# Patient Record
Sex: Male | Born: 1995 | Race: White | Hispanic: No | Marital: Single | Smoking: Never smoker
Health system: Southern US, Community
[De-identification: ages and names within clinical notes are randomized; demographics above are authoritative.]

## PROBLEM LIST (undated history)

## (undated) DIAGNOSIS — E0590 Thyrotoxicosis, unspecified without thyrotoxic crisis or storm: Secondary | ICD-10-CM

## (undated) DIAGNOSIS — E049 Nontoxic goiter, unspecified: Secondary | ICD-10-CM

## (undated) HISTORY — DX: Nontoxic goiter, unspecified: E04.9

## (undated) HISTORY — DX: Thyrotoxicosis, unspecified without thyrotoxic crisis or storm: E05.90

---
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
## 2016-02-18 ENCOUNTER — Ambulatory Visit (INDEPENDENT_AMBULATORY_CARE_PROVIDER_SITE_OTHER): Payer: PRIVATE HEALTH INSURANCE | Admitting: Pediatric Endocrinology

## 2016-02-18 ENCOUNTER — Encounter (INDEPENDENT_AMBULATORY_CARE_PROVIDER_SITE_OTHER): Payer: Self-pay | Admitting: Pediatric Endocrinology

## 2016-02-18 DIAGNOSIS — N529 Male erectile dysfunction, unspecified: Secondary | ICD-10-CM

## 2016-02-18 DIAGNOSIS — Z8639 Personal history of other endocrine, nutritional and metabolic disease: Secondary | ICD-10-CM | POA: Diagnosis not present

## 2016-02-18 NOTE — Patient Instructions (Signed)
Please have morning labs drawn before 10 am one day in the next week.   Labs have been sent to [facility]- if you need them converted to [facility]- please let us know.   If your testosterone level is low- will plan to repeat with some additional testing. If it remains low we can treat with better options that a supplement from [facility].

## 2016-02-18 NOTE — Progress Notes (Signed)
Subjective:  Subjective  Patient Name: Brett Villegas Date of Birth: 13-Jan-1996  MRN: [id number]  Brett Villegas  presents to the office today for follow-up evaluation and management of his Graves disease with hyperthyroidism  HISTORY OF PRESENT ILLNESS:   Brett Villegas is a 20 y.o. Caucasian male   Brett Villegas was accompanied by his self   1. "Brett Villegas" was diagnosed with hyperthyroidism in August 2012. His mother thinks that he had symptoms of Grave's disease for about 1 year prior to diagnosis. He was started on propranolol and methimazole at that time. He developed hives 1 week after starting methimazole and was started on benadryl at that time. He was given a course of steroids and changed from benadryl to attarax when he developed joint swelling and painful hands in addition to the hives. (about 2 weeks after starting methimazole). His symptoms were ascribed to being secondary to his hyperthryoidism.      2. The patient's last [facility] visit was on 02/12/15. In the interim, he has been generally healthy.   He has been busy with work and school. He feels pretty good. He is recuperating from a pinched nerve in his leg for which needed high dose steroids. He is doing plumbing.   He has questions again about D-aspartic acid because he was worried about his gonadal function. He did take it for awhile and felt better on it- but has stopped taking it. He felt sluggish with low sex drive. He is still having spontaneous erections and has not noticed any change in his testes. He has had some issues maintaining an erection but not all the time. He does admit to frequent marijuana use.   He has continued off Methimazole. He has had good temperature regulation and no GI concerns. Weight has been pretty stable to up slightly.   He is no longer having itching or bruising on his legs.    3. Pertinent Review of Systems:  Constitutional: The patient feels "pretty good". The patient seems healthy and active. Eyes:  Vision seems to be good. There are no recognized eye problems. Neck: The patient has no complaints of anterior neck swelling, soreness, tenderness, pressure, discomfort, or difficulty swallowing.   Heart: Heart rate increases with exercise or other physical activity. The patient has no complaints of palpitations, irregular heart beats, chest pain, or chest pressure.   Gastrointestinal: Bowel movents seem normal. The patient has no complaints of excessive hunger, acid reflux, upset stomach, stomach aches or pains, diarrhea, or constipation.  Legs: Muscle mass and strength seem normal. There are no complaints of numbness, tingling, burning, or pain. No edema is noted.  Feet: There are no obvious foot problems. There are no complaints of numbness, tingling, burning, or pain. No edema is noted. Neurologic: There are no recognized problems with muscle movement and strength, sensation, or coordination. GYN/GU: per HPI Skin: no issues   PAST MEDICAL, FAMILY, AND SOCIAL HISTORY  Past Medical History:  Diagnosis Date  . Goiter   . Hyperthyroidism     Family History  Problem Relation Age of Onset  . Thyroid disease Mother   . Cancer Mother   . Heart disease Paternal Aunt   . Heart disease Paternal Uncle   . Cancer Maternal Grandmother   . Cancer Maternal Grandfather   . Stroke Paternal Grandfather      Current Outpatient Prescriptions:  .  hydrOXYzine (ATARAX/VISTARIL) 10 MG tablet, Take 10 mg by mouth 3 (three) times daily as needed., Disp: , Rfl:  .  lidocaine-prilocaine (  EMLA) cream, Apply 1 application topically as needed. (Patient not taking: Reported on 02/18/2016), Disp: 30 g, Rfl: 4 .  methimazole (TAPAZOLE) 5 MG tablet, Take 1.5 tablets (7.5 mg total) by mouth daily. (Patient not taking: Reported on 02/18/2016), Disp: 45 tablet, Rfl: 6 .  predniSONE (STERAPRED UNI-PAK) 10 MG tablet, 2 tablets each morning for 5 days (Patient not taking: Reported on 02/18/2016), Disp: 10 tablet, Rfl:  1  Allergies as of 02/18/2016 - Review Complete 02/18/2016  Allergen Reaction Noted  . Dextromethorphan polistirex er  10/10/2010     reports that he has never smoked. He has never used smokeless tobacco. He reports that he does not drink alcohol or use drugs. Pediatric History  Patient Guardian Status  . Mother:  Brett Villegas  . Father:  Brett Villegas   Other Topics Concern  . Not on file   Social History Narrative   Lives with mom dad and sister. Golf. Fishing. Hunting.    2nd year at [employer]. Wants to transfer to [employer]. Wants to major in agriculture. Primary Care Provider: Hadley PenOBBINS,Brett Villegas  ROS: There are no other significant problems involving Brett Villegas's other body systems.    Objective:  Objective  Vital Signs: BP 106/72   Pulse 78   Wt 156 lb (70.8 kg)     Ht Readings from Last 3 Encounters:  01/30/14 5' 10.32" (1.786 m) (65 %, Z= 0.38)*  09/27/13 5' 10.35" (1.787 m) (67 %, Z= 0.43)*  04/20/13 5' 10.28" (1.785 m) (68 %, Z= 0.47)*   * Growth percentiles are based on CDC 2-20 Years data.   Wt Readings from Last 3 Encounters:  02/18/16 156 lb (70.8 kg) (52 %, Z= 0.05)*  02/12/15 148 lb 9.6 oz (67.4 kg) (46 %, Z= -0.10)*  01/30/14 140 lb (63.5 kg) (39 %, Z= -0.27)*   * Growth percentiles are based on CDC 2-20 Years data.   HC Readings from Last 3 Encounters:  [facility]   There is no height or weight on file to calculate BSA. No height on file for this encounter. 52 %ile (Z= 0.05) based on CDC 2-20 Years weight-for-age data using vitals from 02/18/2016.    PHYSICAL EXAM:  Constitutional: The patient appears healthy and well nourished. The patient's height and weight are normal for age.  Head: The head is normocephalic. Face: The face appears normal. There are no obvious dysmorphic features. Eyes: The eyes appear to be normally formed and spaced. Gaze is conjugate. There is no obvious arcus or proptosis. Moisture appears normal. Ears: The ears  are normally placed and appear externally normal. Mouth: The oropharynx and tongue appear normal. Dentition appears to be normal for age. Oral moisture is normal. Neck: The neck appears to be visibly normal. The thyroid gland is 15 grams in size. The consistency of the thyroid gland is normal. The thyroid gland is not tender to palpation. Lungs: The lungs are clear to auscultation. Air movement is good. Heart: Heart rate and rhythm are regular. Heart sounds S1 and S2 are normal. I did not appreciate any pathologic cardiac murmurs. Abdomen: The abdomen appears to be normal in size for the patient's age. Bowel sounds are normal. There is no obvious hepatomegaly, splenomegaly, or other mass effect.  Arms: Muscle size and bulk are normal for age. ands: There is no obvious tremor. Phalangeal and metacarpophalangeal joints are normal. Palmar muscles are normal for age. Palmar skin is normal. Palmar moisture is also normal. Legs: Muscles appear normal for  age. No edema is present. Feet: Feet are normally formed. Dorsalis pedal pulses are normal. Neurologic: Strength is normal for age in both the upper and lower extremities. Muscle tone is normal. Sensation to touch is normal in both the legs and feet.   GYN: Testes 20-25 cc BL and symmetric with no masses palpable.   LAB DATA: pending    Assessment and Plan:  Assessment  ASSESSMENT: Brett Villegas is a 20 y.o. Caucasian male with history of Graves Disease currently in remission. He has a new complaint of intermittent erectile dysfunction.   1. Grave's Disease- now in remission. Currently clinically euthyroid off therapy. TSI was in normal range last year. Will repeat thyroid function tests once a year.  2. Weight- mild weight gain since last visit- essentially stable 3. Erectile dysfunction- may be secondary to marijuana use vs new hypothyroidism or other source for hypogonadism. Will obtain morning testosterone and gonadotropin levels. If they are low will  need additional evaluation. Advised to limit marijuana use.    PLAN:   1. Diagnostic: TFTs, testosterone, and gonadotropins as morning labs in the next week.  2. Therapeutic: Off therapy for now 3. Patient education:  Discussed concerns as above. Brett Villegas asked appropriate questions and seemed satisfied with discussion and plan.  4. Follow-up: Return in about 1 year (around 02/17/2017).      Dessa PhiJennifer Nidal Rivet, Villegas   LOS Level of Service: This visit lasted in excess of 25 minutes. More than 50% of the visit was devoted to counseling.

## 2017-05-07 ENCOUNTER — Other Ambulatory Visit (INDEPENDENT_AMBULATORY_CARE_PROVIDER_SITE_OTHER): Payer: Self-pay | Admitting: *Deleted

## 2017-05-07 ENCOUNTER — Telehealth (INDEPENDENT_AMBULATORY_CARE_PROVIDER_SITE_OTHER): Payer: Self-pay | Admitting: Pediatric Endocrinology

## 2017-05-07 DIAGNOSIS — Z8639 Personal history of other endocrine, nutritional and metabolic disease: Secondary | ICD-10-CM

## 2017-05-07 NOTE — Telephone Encounter (Signed)
°  Who's calling (name and relationship to patient) : Derrill KayKelia (Mother) Best contact number: [phone] Provider they see: Dr. Vanessa DurhamBadik Reason for call: Mom called to follow up on lab orders Dr. Vanessa DurhamBadik was going to leave for her to pick up at front desk. Mom said she could pick them up tomorrow.

## 2017-05-07 NOTE — Telephone Encounter (Signed)
Spoke to mother, advised that we didn't have the appropriate paperwork to speak to her since Brett Villegas is over 65. She advises she will come pick up the paperwork for him to bring to his visit.

## 2017-05-07 NOTE — Telephone Encounter (Signed)
Called and LVM to call back

## 2017-05-19 ENCOUNTER — Encounter (INDEPENDENT_AMBULATORY_CARE_PROVIDER_SITE_OTHER): Payer: Self-pay | Admitting: Pediatric Endocrinology

## 2017-05-19 ENCOUNTER — Ambulatory Visit (INDEPENDENT_AMBULATORY_CARE_PROVIDER_SITE_OTHER): Payer: PRIVATE HEALTH INSURANCE | Admitting: Pediatric Endocrinology

## 2017-05-19 VITALS — BP 118/76 | HR 86 | Ht 71.06 in | Wt 156.6 lb

## 2017-05-19 DIAGNOSIS — E049 Nontoxic goiter, unspecified: Secondary | ICD-10-CM

## 2017-05-19 DIAGNOSIS — Z8639 Personal history of other endocrine, nutritional and metabolic disease: Secondary | ICD-10-CM

## 2017-05-19 NOTE — Patient Instructions (Signed)
For next visit please get  TSH Free T4 Thyroglobulin ab Thyroid peroxidase ab.   If you are feeling more sluggish- you do not have to wait a year. You can get your labs checked at any time. Your regular doctor may feel comfortable managing your thyroid at this point. If not- you can always call us with your results.

## 2017-05-19 NOTE — Progress Notes (Signed)
Subjective:  Subjective  Patient Name: Brett BongoJeffrey Stencel Date of Birth: Jun 10, 1995  MRN: [id number]  Brett Villegas  presents to the office today for follow-up evaluation and management of his Graves disease with hyperthyroidism  HISTORY OF PRESENT ILLNESS:   [name] is a [age] y.o. Caucasian male   [name] was accompanied by his self   1. "Brett Villegas" was diagnosed with hyperthyroidism in August 2012. His mother thinks that he had symptoms of Grave's disease for about 1 year prior to diagnosis. He was started on propranolol and methimazole at that time. He developed hives 1 week after starting methimazole and was started on benadryl at that time. He was given a course of steroids and changed from benadryl to attarax when he developed joint swelling and painful hands in addition to the hives. (about 2 weeks after starting methimazole). His symptoms were ascribed to being secondary to his hyperthryoidism.      2. The patient's last [facility] visit was on 02/18/16. In the interim, he has been generally healthy.   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   He stopped smoking marijuana because he thought it was impacting his sexual function. He has not noticed an improvement. He started to have performance anxiety. His PCP gave him viagra but he hasn't taken it. He has shied away from relationships because he has anxiety about function.   He has continued to have pain in his leg. He has been seeing PT and doing some stretching- but it is not really helping as much as he would like.   He has been having some intermittent sharp stabbing pain in his chest that lasts a few seconds and goes away. It is not happening very often. He is not having palpitations.   He had an episode where he thought he was dying but his mom thought it was an anxiety attack. He has not talked to his PCP about his anxiety.   He is not  hot all the time. He is sleeping ok. He is feeling a little "sluggish". He sleeps ok. Weight stable.   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   He has continued off Methimazole.  3. Pertinent Review of Systems:  Constitutional: The patient feels "good". The patient seems healthy and active. Eyes: Vision seems to be good. There are no recognized eye problems. Neck: The patient has no complaints of anterior neck swelling, soreness, tenderness, pressure, discomfort, or difficulty swallowing.  Had some anterior neck soreness that resolved.  Heart: Heart rate increases with exercise or other physical activity. The patient has no complaints of palpitations, irregular heart beats, chest pain, or chest pressure.   Lungs: no shortness of breath or wheezing.  Gastrointestinal: Bowel movents seem normal. The patient has no complaints of excessive hunger, acid reflux, upset stomach, stomach aches or pains, diarrhea, or constipation. Some blood on paper when he wipes occasionally. Evaluated by PCP. No hemorrhoids. Taking prilosec PRN Legs: Muscle mass and strength seem normal. There are no complaints of numbness, tingling, burning, or pain. No edema is noted.  Feet: There are no obvious foot problems. There are no complaints of numbness, tingling, burning, or pain. No edema is noted. Neurologic: There are no recognized problems with muscle movement and strength, sensation, or coordination. GYN/GU: per HPI Skin: no issues   PAST MEDICAL, FAMILY, AND SOCIAL HISTORY  Past Medical History:  Diagnosis  Date  . Goiter   . Hyperthyroidism     Family History  Problem Relation Age of Onset  . Thyroid disease Mother   . Cancer Mother   . Heart disease Paternal Aunt   . Heart disease Paternal Uncle   . Cancer Maternal Grandmother   . Cancer Maternal Grandfather   . Stroke Paternal Grandfather      Current Outpatient Medications:  .  hydrOXYzine (ATARAX/VISTARIL) 10 MG tablet, Take  10 mg by mouth 3 (three) times daily as needed., Disp: , Rfl:  .  omeprazole (PRILOSEC) 20 MG capsule, Take by mouth., Disp: , Rfl:  .  lidocaine-prilocaine (EMLA) cream, Apply 1 application topically as needed. (Patient not taking: Reported on 02/18/2016), Disp: 30 g, Rfl: 4 .  methimazole (TAPAZOLE) 5 MG tablet, Take 1.5 tablets (7.5 mg total) by mouth daily. (Patient not taking: Reported on 02/18/2016), Disp: 45 tablet, Rfl: 6 .  predniSONE (STERAPRED UNI-PAK) 10 MG tablet, 2 tablets each morning for 5 days (Patient not taking: Reported on 02/18/2016), Disp: 10 tablet, Rfl: 1  Allergies as of 05/19/2017 - Review Complete 05/19/2017  Allergen Reaction Noted  . Dextromethorphan polistirex er  10/10/2010     reports that  has never smoked. he has never used smokeless tobacco. He reports that he does not drink alcohol or use drugs. Pediatric History  Patient Guardian Status  . Mother:  Amado, Andal  . Father:  Conwell,Eric   Other Topics Concern  . Not on file  Social History Narrative   Lives with mom dad and sister. Golf. Fishing. Hunting.    3nd year at [employer]. Majoring in [place] Care Provider: Hadley Pen, MD  ROS: There are no other significant problems involving Brett Villegas's other body systems.    Objective:  Objective  Vital Signs: BP 118/76 (BP Location: Left Arm, Patient Position: Sitting, Cuff Size: Large)   Pulse 86   Ht 5' 11.06" (1.805 m)   Wt 156 lb 9.6 oz (71 kg)   BMI 21.80 kg/m     Ht Readings from Last 3 Encounters:  05/19/17 5' 11.06" (1.805 m)  01/30/14 5' 10.32" (1.786 m) (65 %, Z= 0.38)*  09/27/13 5' 10.35" (1.787 m) (67 %, Z= 0.43)*   * Growth percentiles are based on CDC (Boys, 2-20 Years) data.   Wt Readings from Last 3 Encounters:  05/19/17 156 lb 9.6 oz (71 kg)  02/18/16 156 lb (70.8 kg) (52 %, Z= 0.05)*  02/12/15 148 lb 9.6 oz (67.4 kg) (46 %, Z= -0.10)*   * Growth percentiles are based on CDC (Boys, 2-20 Years) data.   HC Readings  from Last 3 Encounters:  No data found for [facility]   Body surface area is 1.89 meters squared. Facility age limit for growth percentiles is 20 years. Facility age limit for growth percentiles is 20 years.    PHYSICAL EXAM:  Constitutional: The patient appears healthy and well nourished. The patient's height and weight are normal for age.  His weight is stable.  Head: The head is normocephalic. Face: The face appears normal. There are no obvious dysmorphic features. Eyes: The eyes appear to be normally formed and spaced. Gaze is conjugate. There is no obvious arcus or proptosis. Moisture appears normal. Ears: The ears are normally placed and appear externally normal. Mouth: The oropharynx and tongue appear normal. Dentition appears to be normal for age. Oral moisture is normal. Neck: The neck appears to be visibly normal. The thyroid gland is 15 grams in size.  The consistency of the thyroid gland is normal. The thyroid gland is not tender to palpation. Lungs: The lungs are clear to auscultation. Air movement is good. Heart: Heart rate and rhythm are regular. Heart sounds S1 and S2 are normal. I did not appreciate any pathologic cardiac murmurs. Abdomen: The abdomen appears to be normal in size for the patient's age. Bowel sounds are normal. There is no obvious hepatomegaly, splenomegaly, or other mass effect.  Arms: Muscle size and bulk are normal for age. ands: There is no obvious tremor. Phalangeal and metacarpophalangeal joints are normal. Palmar muscles are normal for age. Palmar skin is normal. Palmar moisture is also normal. Legs: Muscles appear normal for age. No edema is present. Feet: Feet are normally formed. Dorsalis pedal pulses are normal. Neurologic: Strength is normal for age in both the upper and lower extremities. Muscle tone is normal. Sensation to touch is normal in both the legs and feet.    LAB DATA: pending    Assessment and Plan:  Assessment  ASSESSMENT: Bracy is a  22 y.o. Caucasian male with history of Graves Disease currently in remission.   1. Grave's Disease- now in remission. Currently euthyroid to possibly trending towards hypothyroid clinically and chemically .2. Weight-  Weight stable 3. Erectile dysfunction- has viagra from PCP.    PLAN:   1. Diagnostic: TSH, Free T4, Thyroid peroxidase antibody and Thyroglobulin antibody for next visit. May repeat sooner if increase in symptoms (fatigue, constipation etc) 2. Therapeutic: Off therapy for now 3. Patient education:Discussion as above. May transfer care to PCP if desired 4. Follow-up: Return in about 1 year (around 05/20/2018).      Dessa Phi, MD   LOS Level of Service: This visit lasted in excess of 15 minutes. More than 50% of the visit was devoted to counseling.

## 2018-05-20 ENCOUNTER — Encounter (INDEPENDENT_AMBULATORY_CARE_PROVIDER_SITE_OTHER): Payer: Self-pay | Admitting: Pediatric Endocrinology

## 2018-05-20 ENCOUNTER — Ambulatory Visit (INDEPENDENT_AMBULATORY_CARE_PROVIDER_SITE_OTHER): Payer: PRIVATE HEALTH INSURANCE | Admitting: Pediatric Endocrinology

## 2018-05-20 ENCOUNTER — Other Ambulatory Visit: Payer: Self-pay

## 2018-05-20 VITALS — BP 120/76 | HR 72 | Ht 71.26 in | Wt 171.0 lb

## 2018-05-20 DIAGNOSIS — Z8639 Personal history of other endocrine, nutritional and metabolic disease: Secondary | ICD-10-CM

## 2018-05-20 DIAGNOSIS — M2550 Pain in unspecified joint: Secondary | ICD-10-CM | POA: Diagnosis not present

## 2018-05-20 DIAGNOSIS — E049 Nontoxic goiter, unspecified: Secondary | ICD-10-CM

## 2018-05-20 NOTE — Progress Notes (Signed)
Subjective:  Subjective  Patient Name: Brett Villegas Date of Birth: [birth year]  MRN: [id number]  Brett Villegas  presents to the office today for follow-up evaluation and management of his Graves disease with hyperthyroidism  HISTORY OF PRESENT ILLNESS:   Brett Villegas is a 23 y.o. Caucasian male   Brett Villegas was accompanied by his self   1. "Brett Villegas" was diagnosed with hyperthyroidism in August 2012. His mother thinks that he had symptoms of Grave's disease for about 1 year prior to diagnosis. He was started on propranolol and methimazole at that time. He developed hives 1 week after starting methimazole and was started on benadryl at that time. He was given a course of steroids and changed from benadryl to attarax when he developed joint swelling and painful hands in addition to the hives. (about 2 weeks after starting methimazole). His symptoms were ascribed to being secondary to his hyperthryoidism.      2. The patient's last [facility] visit was on 05/19/17. In the interim, he has been generally healthy.   He has been having bl hip pain- stabbing pain in the right and cracking/popping on left with lower back pain. Pain is migratory. He went to see an orthopedist who thought that his pain was likely autoimmune related. He started Brett Villegas on steroids (yesterday) which is helping somewhat. It is a Medrol Dose Pack.   Brett Villegas was complaining of leg pain a year ago at his last visit- he feels that that pain has transformed into his current symptoms.  He has also had some hand/finger pain- especially in his left hand. It it intermittent but makes all his fingers sore and painful. No redness or swelling.   He has not been going to the gym in awhile. He is working in [employer]. 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   Sexual function is back to normal. He is in a stable  relationship.   He is no longer having chest pain or anxiety attacks. He does have some mild underlying anxiety- but "nothing like it was". He is not taking medication for anxiety.   His energy level is pretty good He some low energy days but most days are ok.   He is not having temperature intolerance.   He is not taking any thyroid medication.   He is happy about his intended weight gain.   3. Pertinent Review of Systems:  Constitutional: The patient feels "good.". The patient seems healthy and active. Eyes: Vision seems to be good. There are no recognized eye problems. Neck: The patient has no complaints of anterior neck swelling, soreness, tenderness, pressure, discomfort, or difficulty swallowing.  Heart: Heart rate increases with exercise or other physical activity. The patient has no complaints of palpitations, irregular heart beats, chest pain, or chest pressure.   Lungs: no shortness of breath or wheezing.  Gastrointestinal: Bowel movents seem normal. The patient has no complaints of excessive hunger, acid reflux, upset stomach, stomach aches or pains, diarrhea, or constipation. Continues with some rectal bleeding with stooling. He does not have external hemorrhoids per his PCP exam. Has not seen GI. No longer taking Prilosec Legs: Muscle mass and strength seem normal. There are no complaints of numbness, tingling, burning, or pain. No edema is noted.  Feet: There are no obvious foot problems. There are no complaints of numbness, tingling, burning, or pain. No edema  is noted. Neurologic: There are no recognized problems with muscle movement and strength, sensation, or coordination. GYN/GU: per HPI Skin: no issues Hip and lower back pain per HPI   PAST MEDICAL, FAMILY, AND SOCIAL HISTORY  Past Medical History:  Diagnosis Date  . Goiter   . Hyperthyroidism     Family History  Problem Relation Age of Onset  . Thyroid disease Mother   . Cancer Mother   . Heart disease  Paternal Aunt   . Heart disease Paternal Uncle   . Cancer Maternal Grandmother   . Cancer Maternal Grandfather   . Stroke Paternal Grandfather      Current Outpatient Medications:  .  hydrOXYzine (ATARAX/VISTARIL) 10 MG tablet, Take 10 mg by mouth 3 (three) times daily as needed., Disp: , Rfl:  .  lidocaine-prilocaine (EMLA) cream, Apply 1 application topically as needed. (Patient not taking: Reported on 02/18/2016), Disp: 30 g, Rfl: 4 .  methimazole (TAPAZOLE) 5 MG tablet, Take 1.5 tablets (7.5 mg total) by mouth daily. (Patient not taking: Reported on 02/18/2016), Disp: 45 tablet, Rfl: 6 .  omeprazole (PRILOSEC) 20 MG capsule, Take by mouth., Disp: , Rfl:  .  predniSONE (STERAPRED UNI-PAK) 10 MG tablet, 2 tablets each morning for 5 days (Patient not taking: Reported on 02/18/2016), Disp: 10 tablet, Rfl: 1  Allergies as of 05/20/2018 - Review Complete 05/20/2018  Allergen Reaction Noted  . Dextromethorphan polistirex er  10/10/2010     reports that he has never smoked. He has never used smokeless tobacco. He reports that he does not drink alcohol or use drugs. Pediatric History  Patient Parents  . Bambrick,Eric (Father)  . Shinsato,Keila (Mother)   Other Topics Concern  . Not on file  Social History Narrative   Lives with mom dad and sister. Golf. Fishing. Hunting.    4th year at [employer]. Majoring in business Should graduate 2021- took this semester off.  Primary Care Provider: Myrlene Broker, MD  ROS: There are no other significant problems involving Kayl's other body systems.    Objective:  Objective  Vital Signs: BP 120/76   Pulse 72   Ht 5' 11.26" (1.81 m)   Wt 171 lb (77.6 kg)   BMI 23.68 kg/m    Ht Readings from Last 3 Encounters:  05/20/18 5' 11.26" (1.81 m)  05/19/17 5' 11.06" (1.805 m)  01/30/14 5' 10.32" (1.786 m) (65 %, Z= 0.38)*   * Growth percentiles are based on CDC (Boys, 2-20 Years) data.   Wt Readings from Last 3 Encounters:  05/20/18 171 lb (77.6  kg)  05/19/17 156 lb 9.6 oz (71 kg)  02/18/16 156 lb (70.8 kg) (52 %, Z= 0.05)*   * Growth percentiles are based on CDC (Boys, 2-20 Years) data.   HC Readings from Last 3 Encounters:  No data found for [facility]   Body surface area is 1.98 meters squared. Facility age limit for growth percentiles is 20 years. Facility age limit for growth percentiles is 20 years.    PHYSICAL EXAM:  Constitutional: The patient appears healthy and well nourished. The patient's height and weight are normal for age.  His weight is increased 15 pounds from last year.  Head: The head is normocephalic. Face: The face appears normal. There are no obvious dysmorphic features. Eyes: The eyes appear to be normally formed and spaced. Gaze is conjugate. There is no obvious arcus or proptosis. Moisture appears normal. Ears: The ears are normally placed and appear externally normal. Mouth: The oropharynx  and tongue appear normal. Dentition appears to be normal for age. Oral moisture is normal. Neck: The neck appears to be visibly normal. The thyroid gland is 15 grams in size. The consistency of the thyroid gland is normal. The thyroid gland is not tender to palpation. Lungs: The lungs are clear to auscultation. Air movement is good. Heart: Heart rate and rhythm are regular. Heart sounds S1 and S2 are normal. I did not appreciate any pathologic cardiac murmurs. Abdomen: The abdomen appears to be normal in size for the patient's age. Bowel sounds are normal. There is no obvious hepatomegaly, splenomegaly, or other mass effect.  Arms: Muscle size and bulk are normal for age. ands: There is no obvious tremor. Phalangeal and metacarpophalangeal joints are normal. Palmar muscles are normal for age. Palmar skin is normal. Palmar moisture is also normal. Legs: Muscles appear normal for age. No edema is present. Anterior tenderness over iliac L>R Feet: Feet are normally formed. Dorsalis pedal pulses are normal. Neurologic: Strength  is normal for age in both the upper and lower extremities. Muscle tone is normal. Sensation to touch is normal in both the legs and feet.    LAB DATA: pending    Assessment and Plan:  Assessment  ASSESSMENT: Jadiel is a 23 y.o. Caucasian male with history of Graves Disease currently in remission.    1. Grave's Disease- now in remission. Labs today 2. Migratory joint pain- on steroids which will impact immunology labs. Will get CRP/ESR- may be normal secondary to steroids. If they are increased will refer to rheum. If they are normal but symptoms persist could repeat off steroids.  3. Weight-  Intentional weight gain  PLAN:    1. Diagnostic: TSH, Free T4, TSI, Thyroid peroxidase antibody and Thyroglobulin antibody, ESR and CRP today 2. Therapeutic: Off therapy for now 3. Patient education:Discussion as above. 4. Follow-up: Return for transition to adult endo .      Lelon Huh, MD  Level of Service: This visit lasted in excess of 25 minutes. More than 50% of the visit was devoted to counseling.

## 2018-05-20 NOTE — Patient Instructions (Signed)
Labs today in pediatric neurology clinic.   Schedule with Dr. Lafe Garin or Dr. Nicanor Alcon in [facility] for next spring.

## 2018-05-24 LAB — T4, FREE: FREE T4: 1.2 ng/dL (ref 0.8–1.8)

## 2018-05-24 LAB — TSH: TSH: 3.12 mIU/L (ref 0.40–4.50)

## 2018-05-24 LAB — SEDIMENTATION RATE: Sed Rate: 6 mm/h (ref 0–15)

## 2018-05-24 LAB — CBC WITH DIFFERENTIAL/PLATELET
ABSOLUTE MONOCYTES: 689 {cells}/uL (ref 200–950)
BASOS ABS: 42 {cells}/uL (ref 0–200)
Basophils Relative: 0.5 %
EOS ABS: 67 {cells}/uL (ref 15–500)
Eosinophils Relative: 0.8 %
HEMATOCRIT: 41.3 % (ref 38.5–50.0)
Hemoglobin: 14.4 g/dL (ref 13.2–17.1)
LYMPHS ABS: 2268 {cells}/uL (ref 850–3900)
MCH: 29.6 pg (ref 27.0–33.0)
MCHC: 34.9 g/dL (ref 32.0–36.0)
MCV: 84.8 fL (ref 80.0–100.0)
MPV: 10 fL (ref 7.5–12.5)
Monocytes Relative: 8.2 %
NEUTROS PCT: 63.5 %
Neutro Abs: 5334 cells/uL (ref 1500–7800)
Platelets: 250 10*3/uL (ref 140–400)
RBC: 4.87 10*6/uL (ref 4.20–5.80)
RDW: 12.1 % (ref 11.0–15.0)
Total Lymphocyte: 27 %
WBC: 8.4 10*3/uL (ref 3.8–10.8)

## 2018-05-24 LAB — THYROID STIMULATING IMMUNOGLOBULIN

## 2018-05-24 LAB — C-REACTIVE PROTEIN: CRP: 1 mg/L (ref <8.0)

## 2018-05-24 LAB — THYROGLOBULIN ANTIBODY: THYROGLOBULIN AB: 454 [IU]/mL — AB (ref <=1)

## 2018-05-24 LAB — THYROID PEROXIDASE ANTIBODY: THYROID PEROXIDASE ANTIBODY: 609 [IU]/mL — AB (ref <9)

## 2018-05-25 ENCOUNTER — Encounter (INDEPENDENT_AMBULATORY_CARE_PROVIDER_SITE_OTHER): Payer: Self-pay | Admitting: *Deleted

## 2019-03-31 ENCOUNTER — Telehealth (INDEPENDENT_AMBULATORY_CARE_PROVIDER_SITE_OTHER): Payer: Self-pay | Admitting: "Endocrinology

## 2019-03-31 NOTE — Telephone Encounter (Signed)
  Who's calling (name and relationship to patient) :  Talbert Forest - Mom   Best contact number: [phone]  Provider they see: Dr Vanessa [place]   Reason for call:   Mom called to advise that they have found a new adult endocrinologist. Please send referral to  [facility]  Dr Carlus Pavlov  Fax# [phone] Please call mom when complete or if anything else needs to be done on the patients part.   PRESCRIPTION REFILL ONLY  Name of prescription:  Pharmacy:

## 2019-03-31 NOTE — Telephone Encounter (Signed)
Referral sent 

## 2019-04-08 ENCOUNTER — Telehealth (INDEPENDENT_AMBULATORY_CARE_PROVIDER_SITE_OTHER): Payer: Self-pay | Admitting: "Endocrinology

## 2019-04-08 ENCOUNTER — Encounter (INDEPENDENT_AMBULATORY_CARE_PROVIDER_SITE_OTHER): Payer: Self-pay | Admitting: "Endocrinology

## 2019-04-08 DIAGNOSIS — M2550 Pain in unspecified joint: Secondary | ICD-10-CM

## 2019-04-08 DIAGNOSIS — Z8639 Personal history of other endocrine, nutritional and metabolic disease: Secondary | ICD-10-CM

## 2019-04-08 DIAGNOSIS — E049 Nontoxic goiter, unspecified: Secondary | ICD-10-CM

## 2019-04-08 NOTE — Telephone Encounter (Signed)
  Who's calling (name and relationship to patient) : Derrill Kay (mom) Best contact number: [phone] Provider they see: Fransico Michael  Reason for call: Request labs before upcoming appt 05/02/19    PRESCRIPTION REFILL ONLY  Name of prescription:  Pharmacy:

## 2019-04-27 ENCOUNTER — Telehealth (INDEPENDENT_AMBULATORY_CARE_PROVIDER_SITE_OTHER): Payer: Self-pay | Admitting: "Endocrinology

## 2019-04-27 NOTE — Telephone Encounter (Signed)
Error

## 2019-04-29 ENCOUNTER — Other Ambulatory Visit (INDEPENDENT_AMBULATORY_CARE_PROVIDER_SITE_OTHER): Payer: Self-pay

## 2019-04-29 DIAGNOSIS — Z8639 Personal history of other endocrine, nutritional and metabolic disease: Secondary | ICD-10-CM

## 2019-04-29 DIAGNOSIS — M2550 Pain in unspecified joint: Secondary | ICD-10-CM

## 2019-04-29 DIAGNOSIS — E049 Nontoxic goiter, unspecified: Secondary | ICD-10-CM

## 2019-05-02 ENCOUNTER — Ambulatory Visit (INDEPENDENT_AMBULATORY_CARE_PROVIDER_SITE_OTHER): Payer: Managed Care, Other (non HMO) | Admitting: "Endocrinology

## 2019-05-02 ENCOUNTER — Encounter (INDEPENDENT_AMBULATORY_CARE_PROVIDER_SITE_OTHER): Payer: Self-pay | Admitting: "Endocrinology

## 2019-05-02 ENCOUNTER — Other Ambulatory Visit: Payer: Self-pay

## 2019-05-02 VITALS — BP 126/70 | HR 84 | Wt 184.0 lb

## 2019-05-02 DIAGNOSIS — E063 Autoimmune thyroiditis: Secondary | ICD-10-CM | POA: Insufficient documentation

## 2019-05-02 DIAGNOSIS — K219 Gastro-esophageal reflux disease without esophagitis: Secondary | ICD-10-CM | POA: Diagnosis not present

## 2019-05-02 DIAGNOSIS — E0500 Thyrotoxicosis with diffuse goiter without thyrotoxic crisis or storm: Secondary | ICD-10-CM

## 2019-05-02 MED ORDER — OMEPRAZOLE 20 MG PO CPDR: DELAYED_RELEASE_CAPSULE | ORAL | 11 refills | Status: AC

## 2019-05-02 NOTE — Patient Instructions (Signed)
Follow up visit in one year. Please repeat lab tests about 2 weeks prior.

## 2019-05-02 NOTE — Progress Notes (Signed)
Subjective:  Subjective  Patient Name: Brett Villegas Date of Birth: 1995-08-28  MRN: [id number]  Brett Villegas  [facility], pronounced [place]) presents to the office today for follow-up evaluation and management of his autoimmune thyroid disease, both Graves disease and Hashimoto's disease.   HISTORY OF PRESENT ILLNESS:   Brett Villegas is a 24 y.o. Caucasian young man.  [place] was unaccompanied.  1. 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 He was also diagnosed with Hashimoto's disease at the same time. His mother thought that he had symptoms of Grave's disease for about 1 year prior to diagnosis. He was started on propranolol and methimazole at that time. He developed hives 1 week after starting methimazole and was started on benadryl at that time. He was given a course of steroids and changed from benadryl to atarax when he developed joint swelling and painful hands in addition to the hives. about 2 weeks after starting methimazole. His symptoms were ascribed to being secondary to his hyperthyroidism.    2. Clinical course:  A. Brett Villegas continued to take methimazole through November 2015, but had weaned off the medication in 2016 and was euthyroid in December 2016.   B. He has remained euthyroid since then.   3.The patient's last [facility] visit was on 05/20/18 with Dr. Baldo Ash. Since he is now an adult, he has been transferred to me for his adult endocrine care.   A. In the interim, he has been generally healthy, except for covid-19 in November 2020. He has completely recovered.   B. He has felt more sluggish in the past year. He has put on about 20 pounds. He has not been going to the gym. He is active at work, hunts a lot, and backpacks during hunts. He doesn't think his diet has changed. He feels pretty warm, much warmer than his girlfriend. He recognizes that he is more out of shape than he used to be.   C. He had more hip pains  about a month ago, but these pains have since resolved.   D. He has not had any hand pains for some time.   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   F. 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   G. Since quitting smoking, his sexual function has been good.   H. He still has some anxiety from time to time associated with dreams, but not as bad as it was. He dreams every night and sometimes has sensations of deja vu during the dreams that cause him to wake up being anxious.    I. He drinks caffeinated soft drinks and an occasional Red Bull. He occasionally has 1-2 alcohol drinks on weekends.   3. Pertinent Review of Systems:  Constitutional: The patient feels "pretty good, but is somewhat slow getting started in the mornings". The morning slowness is worse when he has bad dreams and has not slept well.  Eyes: Vision seems to be good. There are no recognized eye problems. Neck: The patient has no complaints of anterior neck swelling, soreness, tenderness, pressure, discomfort, or difficulty swallowing.  Heart: Heart rate increases with exercise or other physical activity. The patient has no complaints of palpitations, irregular heart beats, chest pain, or chest pressure.   Lungs: No shortness of breath or wheezing.  Gastrointestinal: He has chronic  acid reflux. Bowel movents seem normal. The patient has no complaints of excessive hunger, upset stomach, stomach aches or pains, diarrhea, or constipation. He does have external hemorrhoids. He would like to go back on Prilosec.  Legs: As above. Muscle mass and strength seem normal. There are no complaints of numbness, tingling, burning, or pain. No edema is noted.  Feet: There are no obvious foot problems. There are no complaints of numbness, tingling, burning, or pain. No edema is noted. Neurologic: There are no  recognized problems with muscle movement and strength, sensation, or coordination. GU: As above Skin: no issues  PAST MEDICAL, FAMILY, AND SOCIAL HISTORY  Past Medical History:  Diagnosis Date  . Goiter   . Hyperthyroidism     Family History  Problem Relation Age of Onset  . Thyroid disease Mother   . Cancer Mother   . Heart disease Paternal Aunt   . Heart disease Paternal Uncle   . Cancer Maternal Grandmother   . Cancer Maternal Grandfather   . Stroke Paternal Grandfather      Current Outpatient Medications:  .  hydrOXYzine (ATARAX/VISTARIL) 10 MG tablet, Take 10 mg by mouth 3 (three) times daily as needed., Disp: , Rfl:  .  lidocaine-prilocaine (EMLA) cream, Apply 1 application topically as needed. (Patient not taking: Reported on 02/18/2016), Disp: 30 g, Rfl: 4 .  methimazole (TAPAZOLE) 5 MG tablet, Take 1.5 tablets (7.5 mg total) by mouth daily. (Patient not taking: Reported on 02/18/2016), Disp: 45 tablet, Rfl: 6 .  omeprazole (PRILOSEC) 20 MG capsule, Take one capsule twice daily., Disp: 60 capsule, Rfl: 11 .  predniSONE (STERAPRED UNI-PAK) 10 MG tablet, 2 tablets each morning for 5 days (Patient not taking: Reported on 02/18/2016), Disp: 10 tablet, Rfl: 1 .  scopolamine (TRANSDERM-SCOP) 1 MG/3DAYS, Place onto the skin., Disp: , Rfl:   Allergies as of 05/02/2019 - Review Complete 05/02/2019  Allergen Reaction Noted  . Dextromethorphan polistirex er  10/10/2010     reports that he has never smoked. He has never used smokeless tobacco. He reports that he does not drink alcohol or use drugs. Pediatric History  Patient Parents  . Whitehair,Eric (Father)  . Aicher,Keila (Mother)   Other Topics Concern  . Not on file  Social History Narrative   Lives with mom dad and sister. Golf. Fishing. Hunting.    Lives at home with his parents. Works in his [employer].  Stays active as a Banker.  Primary Care Provider: Myrlene Broker, MD  ROS:  There are no other significant problems involving Brett Villegas's other body systems.    Objective:  Objective  Vital Signs:  BP 126/70   Pulse 84   Wt 184 lb (83.5 kg)   BMI 25.48 kg/m    Ht Readings from Last 3 Encounters:  05/20/18 5' 11.26" (1.81 m)  05/19/17 5' 11.06" (1.805 m)  01/30/14 5' 10.32" (1.786 m) (65 %, Z= 0.38)*   * Growth percentiles are based on CDC (Boys, 2-20 Years) data.   Wt Readings from Last 3 Encounters:  05/02/19 184 lb (83.5 kg)  05/20/18 171 lb (77.6 kg)  05/19/17 156 lb 9.6 oz (71 kg)   HC Readings from Last 3 Encounters:  No data found for [facility]   Body surface area is 2.05 meters squared. Facility age limit for growth percentiles is 20 years. Facility age limit for growth percentiles is 20 years.  BMI is 25.6.  PHYSICAL EXAM:  Constitutional: Brett Villegas appears healthy, but somewhat  overweight. The patient's height and weight are normal for age.  His weight has increased 13 pounds from last year, a net calore increase of about 130 calories per day. He appears to have gained more fat and lost some muscle.  Head: The head is normocephalic. Face: The face appears normal. There are no obvious dysmorphic features. Eyes: The eyes appear to be normally formed and spaced. Gaze is conjugate. There is no obvious arcus or proptosis. Moisture appears normal. Ears: The ears are normally placed and appear externally normal. Mouth: The oropharynx and tongue appear normal. Dentition appears to be normal for age. Oral moisture is normal. Neck: The neck appears to be visibly normal. The thyroid gland is enlarged at about 21-22 grams in 15 grams in size. Both lobes are symmetrically enlarged. The isthmus is also enlarged. The consistency of the gland is relatively full, c/w Hashimoto's disease.  The thyroid gland is not tender to palpation. Lungs: The lungs are clear to auscultation. Air movement is good. Heart: Heart rate and rhythm are regular. Heart sounds S1 and S2 are  normal. I did not appreciate any pathologic cardiac murmurs. Abdomen: The abdomen is enlarged. Bowel sounds are normal. There is no obvious hepatomegaly, splenomegaly, or other mass effect.  Arms: Muscle size and bulk are normal for age. ands: There is no obvious tremor. Phalangeal and metacarpophalangeal joints are normal. Palmar muscles are normal for age. Palmar skin is normal. Palmar moisture is also normal. Legs: Muscles appear normal for age. No edema is present.  Neurologic: Strength is normal for age in both the upper and lower extremities. Muscle tone is normal. Sensation to touch is normal in both the legs and feet.    LAB DATA:   Labs 04/29/19: TSH 2.84, free T3 3.6; TSI, TPO, and thyroglobulin antibodies pending.   Labs 05/20/18: TSH 3.12, free T4 1.2, TPO antibody 609 (ref <9), thyroglobulin antibody 454 (ref < or = 1); TSI <89 (ref <140% baseline); CBC normal; ESR 6 (ref 0-15), CRP 1.0 (ref <8)    Assessment and Plan:  Assessment  ASSESSMENT: Hervey is a 24 y.o. Caucasian male with history of Graves Disease currently in remission.    1. Diffuse thyrotoxicosis (Graves' Disease):   A. His TFTs are again within normal, today at about the 15% of the physiologic range.   B. His Graves' disease remains in remission, primarily due to the loss of thyrocytes caused by his Hashimoto's disease.   C. He has what the old endocrinologists used to call "burned out Graves' disease", before it was recognized that having the combination of both Graves' disease and Hashimoto's disease would usually result in a prolonged period of remission of hyperthyroidism, followed by progressive loss of thyrocytes cause by the Hashimoto's disease T lymphocytes, resulting in hypothyroidism in most patients  2. Hashimoto's disease: He has Hashimoto's disease. The Hashimoto's disease is dominant now . 3. Weight: His weight is normal, but he has more fat weight and less muscle weight than he used to have.  4.  GERD: I ordered omeprazole for him.   PLAN:  1. Diagnostic: TSH, free T4, free T3, TSI in one year. Call if being unusually sluggish or if being unusually hyper.  2. Therapeutic: Remain off thionamide therapy for now. Start omeprazole, 20 mg, twice daily. 3. Patient education: We discussed the pathophysiology of both Graves' disease and Hashimoto's disease and the probability that he will eventually become permanently hypothyroid. We also discussed his weight gain, our Eat Right Diet food  sheet, the [place] recipes, and how to exercise for weight loss.  4. Follow-up:  One year   Level of Service: This visit lasted in excess of 70 minutes. More than 50% of the visit was devoted to counseling.   Tillman Sers, MD, CDE Adult and Pediatric Endocrinology

## 2019-05-03 LAB — T3, FREE: T3, Free: 3.6 pg/mL (ref 2.3–4.2)

## 2019-05-03 LAB — TSH: TSH: 2.84 mIU/L (ref 0.40–4.50)

## 2019-05-03 LAB — THYROGLOBULIN ANTIBODY: Thyroglobulin Ab: 286 IU/mL — ABNORMAL HIGH (ref <=1)

## 2019-05-03 LAB — THYROID STIMULATING IMMUNOGLOBULIN: TSI: 256 % baseline — ABNORMAL HIGH (ref <140)

## 2019-05-03 LAB — THYROID PEROXIDASE ANTIBODY: Thyroperoxidase Ab SerPl-aCnc: 781 IU/mL — ABNORMAL HIGH (ref <9)

## 2019-05-09 ENCOUNTER — Telehealth (INDEPENDENT_AMBULATORY_CARE_PROVIDER_SITE_OTHER): Payer: Self-pay | Admitting: *Deleted

## 2019-05-09 DIAGNOSIS — E0500 Thyrotoxicosis with diffuse goiter without thyrotoxic crisis or storm: Secondary | ICD-10-CM

## 2019-05-09 NOTE — Telephone Encounter (Signed)
-----   Message from David Stall, MD sent at 05/08/2019 10:55 PM EST ----- Brett Villegas's thyroid function tests are normal, but higher.  His Hashimoto's disease antibodies are still quite active.  He now has elevated thyroid stimulatin immunoglobulin (TSI) antibodies for Graves' disease. These antibodies were normal in 2020, but were elevated back in 2012.  Because of the rise in TSI, it is possible that he could become hyperthyroid again. It is prudent to check his TFTs and TSI level every 3 months and see him in follow up in 6 months instead of the 12 months we had discussed at his last visit.   Clinical staff: Please order TSH, free T4, free T3 and TSI to be done in 3 months and 6 months. Please arrange for me to see him in follow up in 6 months. Thanks. Dr. Fransico Michael

## 2019-05-09 NOTE — Telephone Encounter (Signed)
Call and spoke to [facility] to let him know that his thyroid function tests were normal, but higher.  His Hashimoto's disease antibodies are still quite active. He now has elevated thyroid stimulatin immunoglobulin (TSI) antibodies for Graves' disease. These antibodies were normal in 2020, but were elevated back in 2012.  Because of the rise in TSI, it is possible that he could become hyperthyroid again. It is prudent to check his TFTs and TSI level every 3 months. Brett Villegas is aware that he needs to come in June and September to have labs drawn and to follow up with Dr. Fransico Michael in 6 months.

## 2020-05-17 ENCOUNTER — Telehealth (INDEPENDENT_AMBULATORY_CARE_PROVIDER_SITE_OTHER): Payer: Self-pay | Admitting: "Endocrinology

## 2020-05-17 DIAGNOSIS — E049 Nontoxic goiter, unspecified: Secondary | ICD-10-CM

## 2020-05-17 DIAGNOSIS — Z8639 Personal history of other endocrine, nutritional and metabolic disease: Secondary | ICD-10-CM

## 2020-05-17 DIAGNOSIS — E063 Autoimmune thyroiditis: Secondary | ICD-10-CM

## 2020-05-17 DIAGNOSIS — E0500 Thyrotoxicosis with diffuse goiter without thyrotoxic crisis or storm: Secondary | ICD-10-CM

## 2020-05-17 NOTE — Telephone Encounter (Signed)
Put labs in for [facility]. Called patient to let him know. Patient states that he needed to R/S his appointment on the 21st due to having another appointment at that time. We R/S for April 14th. Patient is aware to go ahead and have labs drawn prior to appointment.

## 2020-05-17 NOTE — Telephone Encounter (Signed)
Who's calling (name and relationship to patient) : Leotis Shames "[name]"  Best contact number: [phone]  Provider they see: Dr. Fransico Michael  Reason for call:  Called to schedule follow up, requesting labs to be sent to the [facility] in [place]. Please notify when this is completed so he can get those drawn.  [address] [place], [place] [address]  Call ID:      PRESCRIPTION REFILL ONLY  Name of prescription:  Pharmacy:

## 2020-05-21 ENCOUNTER — Ambulatory Visit (INDEPENDENT_AMBULATORY_CARE_PROVIDER_SITE_OTHER): Payer: Managed Care, Other (non HMO) | Admitting: "Endocrinology

## 2020-06-07 LAB — TSH: TSH: 5.34 u[IU]/mL — ABNORMAL HIGH (ref 0.450–4.500)

## 2020-06-07 LAB — THYROID STIMULATING IMMUNOGLOBULIN: Thyroid Stim Immunoglobulin: 0.2 IU/L (ref 0.00–0.55)

## 2020-06-07 LAB — T3, FREE: T3, Free: 3.2 pg/mL (ref 2.0–4.4)

## 2020-06-07 LAB — T4, FREE: Free T4: 1.09 ng/dL (ref 0.82–1.77)

## 2020-06-14 ENCOUNTER — Other Ambulatory Visit: Payer: Self-pay

## 2020-06-14 ENCOUNTER — Encounter (INDEPENDENT_AMBULATORY_CARE_PROVIDER_SITE_OTHER): Payer: Self-pay | Admitting: "Endocrinology

## 2020-06-14 ENCOUNTER — Ambulatory Visit (INDEPENDENT_AMBULATORY_CARE_PROVIDER_SITE_OTHER): Payer: Managed Care, Other (non HMO) | Admitting: "Endocrinology

## 2020-06-14 VITALS — BP 110/76 | HR 74 | Wt 180.4 lb

## 2020-06-14 DIAGNOSIS — E0500 Thyrotoxicosis with diffuse goiter without thyrotoxic crisis or storm: Secondary | ICD-10-CM

## 2020-06-14 DIAGNOSIS — K219 Gastro-esophageal reflux disease without esophagitis: Secondary | ICD-10-CM

## 2020-06-14 DIAGNOSIS — E063 Autoimmune thyroiditis: Secondary | ICD-10-CM

## 2020-06-14 NOTE — Progress Notes (Signed)
Subjective:  Subjective  Patient Name: Brett Villegas Date of Birth: Apr 14, 1995  MRN: [id number]  Brett Villegas  [facility], pronounced [place]) presents to the office today for follow-up evaluation and management of his autoimmune thyroid disease, both Graves disease and Hashimoto's disease.   HISTORY OF PRESENT ILLNESS:   Brett Villegas is a 25 y.o. Caucasian young man.  Brett Villegas was unaccompanied.  1. 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 He was also diagnosed with Hashimoto's disease at the same time. His mother thought that he had symptoms of Grave's disease for about 1 year prior to diagnosis. He was started on propranolol and methimazole at that time. He developed hives 1 week after starting methimazole and was started on benadryl at that time. He was given a course of steroids and changed from benadryl to atarax when he developed joint swelling and painful hands in addition to the hives. about 2 weeks after starting methimazole. His symptoms were ascribed to being secondary to his hyperthyroidism.    2. Clinical course:  A. Brett Villegas continued to take methimazole through November 2015, but had weaned off the medication in 2016 and was euthyroid in December 2016.   B. He had remained euthyroid through 12/29/19.   C. He had covid-19 in November 2020. He has completely recovered.    3.The patient's last [facility] visit was on 05/02/19. I asked him to remain off thionamides. I started him on omeprazole, 20 mg, twice daily. The medication helped. He took it for 6 months. His dyspepsia and heartburn essentially resolved.   A. In the interim, he has been generally healthy, but is more tired and sluggish.   B. He has not been going to the gym. He is active at work, hunts a lot, and backpacks during hunts. He doesn't think his diet has changed. He feels pretty average in terms of temperature. His temperature sensation is similar to his  girlfriend.   C. He does not have many hip pains, but does have some pains in his hamstrings.    D. He has not had any hand pains for some time, but his hands are direr and he is having skin peeling.    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   F. 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   G. Since quitting smoking, his sexual function has been good.   H. He still has some anxiety from time to time associated with dreams, but not as bad as it was. He dreams every night and sometimes has sensations of deja vu during the dreams that cause him to wake up being anxious.    I. He drinks caffeinated soft drinks occasionally. He occasionally has 1-2 alcohol drinks on weekends.   3. Pertinent Review of Systems:  Constitutional: The patient feels "more tired and sluggish most of the time." By the end of the day, he is "pretty beat'.  Eyes: Vision seems to be good, but he sometimes has blurring. There are no recognized eye problems. Neck: The patient has no complaints of anterior neck swelling, soreness, tenderness, pressure, discomfort, or difficulty swallowing.  Heart: Heart rate increases with exercise or other physical activity. The patient has no complaints of palpitations, irregular heart beats, chest pain, or chest pressure.   Lungs: No shortness of breath or wheezing.  Gastrointestinal: His reflux has pretty much resolved. Bowel movents seem normal. The patient has no complaints of excessive hunger, upset stomach, stomach aches or pains, diarrhea, or constipation. He no longer has much bleeding from external hemorrhoids.  Hands: Dry Legs: As above. Muscle mass and strength seem normal. There are no complaints of numbness, tingling, burning, or pain. No edema is noted.  Feet: There are no obvious foot problems. There are no complaints of numbness, tingling,  burning, or pain. No edema is noted. Neurologic: There are no recognized problems with muscle movement and strength, sensation, or coordination. GU: No problems Skin: Dry  PAST MEDICAL, FAMILY, AND SOCIAL HISTORY  Past Medical History:  Diagnosis Date  . Goiter   . Hyperthyroidism     Family History  Problem Relation Age of Onset  . Thyroid disease Mother   . Cancer Mother   . Heart disease Paternal Aunt   . Heart disease Paternal Uncle   . Cancer Maternal Grandmother   . Cancer Maternal Grandfather   . Stroke Paternal Grandfather      Current Outpatient Medications:  .  hydrOXYzine (ATARAX/VISTARIL) 10 MG tablet, Take 10 mg by mouth 3 (three) times daily as needed. (Patient not taking: Reported on 06/14/2020), Disp: , Rfl:  .  lidocaine-prilocaine (EMLA) cream, Apply 1 application topically as needed. (Patient not taking: No sig reported), Disp: 30 g, Rfl: 4 .  methimazole (TAPAZOLE) 5 MG tablet, Take 1.5 tablets (7.5 mg total) by mouth daily. (Patient not taking: No sig reported), Disp: 45 tablet, Rfl: 6 .  omeprazole (PRILOSEC) 20 MG capsule, Take one capsule twice daily., Disp: 60 capsule, Rfl: 11 .  predniSONE (STERAPRED UNI-PAK) 10 MG tablet, 2 tablets each morning for 5 days (Patient not taking: No sig reported), Disp: 10 tablet, Rfl: 1 .  scopolamine (TRANSDERM-SCOP) 1 MG/3DAYS, Place onto the skin. (Patient not taking: Reported on 06/14/2020), Disp: , Rfl:   Allergies as of 06/14/2020 - Review Complete 06/14/2020  Allergen Reaction Noted  . Dextromethorphan polistirex er  10/10/2010     reports that he has never smoked. He has never used smokeless tobacco. He reports that he does not drink alcohol and does not use drugs. Pediatric History  Patient Parents  . Brett Villegas,Brett Villegas (Father)  . Brett Villegas,Brett Villegas (Mother)   Other Topics Concern  . Not on file  Social History Narrative   Lives with mom dad and sister. Golf. Fishing. Hunting.    Lives with his girlfriend. A baby is  on the way and delivery should be in July.  Works in his [employer].  Stays active as a Banker.  Primary Care Provider: Myrlene Broker, MD  ROS: There are no other significant problems involving Brett Villegas's other body systems.    Objective:  Objective  Vital Signs:  BP 110/76 (BP Location: Right Arm, Patient Position: Sitting, Cuff Size: Normal)   Pulse 74   Wt 180 lb 6.4 oz (81.8 kg)   BMI 24.98 kg/m    Ht Readings from Last 3 Encounters:  05/20/18 5' 11.26" (1.81 m)  05/19/17 5' 11.06" (1.805 m)  01/30/14 5' 10.32" (1.786 m) (65 %, Z= 0.38)*   * Growth percentiles are based on CDC (Boys, 2-20 Years) data.   Wt Readings from Last 3 Encounters:  06/14/20 180 lb 6.4 oz (81.8 kg)  05/02/19 184 lb (83.5 kg)  05/20/18 171 lb (77.6 kg)   HC Readings from Last 3 Encounters:  No data found for Brett Villegas  Body surface area is 2.03 meters squared. Facility age limit for growth percentiles is 20 years. Facility age limit for growth percentiles is 20 years.  PHYSICAL EXAM:  Constitutional: Brett Villegas appears healthy, but somewhat overweight. The patient's height and weight are normal for age.  His weight has decreased 3 pounds from last year.  Head: The head is normocephalic. Face: The face appears normal. There are no obvious dysmorphic features. Eyes: The eyes appear to be normally formed and spaced. Gaze is conjugate. There is no obvious arcus or proptosis. Moisture appears normal. Ears: The ears are normally placed and appear externally normal. Mouth: The oropharynx and tongue appear normal. Dentition appears to be normal for age. Oral moisture is normal. Neck: The neck appears to be visibly normal. The thyroid gland is enlarged at about 22 grams in size. Both lobes are symmetrically enlarged. The isthmus is also enlarged. The consistency of the gland is relatively full, c/w Hashimoto's disease.  The thyroid gland is not tender to palpation. Lungs: The lungs  are clear to auscultation. Air movement is good. Heart: Heart rate and rhythm are regular. Heart sounds S1 and S2 are normal. I did not appreciate any pathologic cardiac murmurs. Abdomen: The abdomen is enlarged. Bowel sounds are normal. There is no obvious hepatomegaly, splenomegaly, or other mass effect.  Arms: Muscle size and bulk are normal for age. ands: There is no obvious tremor. Phalangeal and metacarpophalangeal joints are normal. Palmar muscles are normal for age. Palmar skin is dry.  Legs: Muscles appear normal for age. No edema is present.  Neurologic: Strength is normal for age in both the upper and lower extremities. Muscle tone is normal. Sensation to touch is normal in both the legs and feet.    LAB DATA:   Labs 06/05/20: TSH 5.34, free T4 1.09, free T3 3.2, TSI 0.20 (ref 0-0.55)  Labs 04/29/19: TSH 2.84, free T3 3.6; TSI, TPO, and thyroglobulin antibodies pending.   Labs 05/20/18: TSH 3.12, free T4 1.2, TPO antibody 609 (ref <9), thyroglobulin antibody 454 (ref < or = 1); TSI <89 (ref <140% baseline); CBC normal; ESR 6 (ref 0-15), CRP 1.0 (ref <8)    Assessment and Plan:  Assessment  ASSESSMENT: Brett Villegas is a 25 y.o. Caucasian male with history of Graves Disease currently in remission.   1. Diffuse thyrotoxicosis (Graves' Disease):   A. His TFTs are abnormal now, with an elevated TSH and lower free T4 and free T3, c/w "mild hypothyroidism.   B. His Graves' disease remains in remission, primarily due to the loss of thyrocytes caused by his Hashimoto's disease.   C. He has what the old endocrinologists used to call "burned out Graves' disease", before it was recognized that having the combination of both Graves' disease and Hashimoto's disease would usually result in a prolonged period of remission of hyperthyroidism, followed by progressive loss of thyrocytes cause by the Hashimoto's disease T lymphocytes, resulting in hypothyroidism in most patients  2. Hashimoto's disease: He  has Hashimoto's disease. The Hashimoto's disease is dominant now . 3. Weight: His weight is normal, but he has somewhat more fat weight and less muscle weight than he used to have.  4. GERD: He is doing much better and doesn't think he needs omeprazole. Marland Kitchen   PLAN:  1. Diagnostic: TSH, free T4, free T3, in one month.  2. Therapeutic: Remain off thionamide therapy.. 3. Patient education: We discussed the pathophysiology of both Graves' disease and Hashimoto's disease and the probability that he will eventually  become permanently hypothyroid. We also discussed his weight gain, our Eat Right Diet food sheet, the [place] recipes, and how to exercise for weight loss. If his TFTs are still low in one month, I will start him on levothyroxine.  4. Follow-up:  3 months   Level of Service: This visit lasted in excess of 50 minutes. More than 50% of the visit was devoted to counseling.   Tillman Sers, MD, CDE Adult and Pediatric Endocrinology

## 2020-06-14 NOTE — Patient Instructions (Signed)
Follow up visit in 3 months. Please repeat thyroid tests in one month.

## 2020-09-23 NOTE — Progress Notes (Deleted)
Subjective:  Subjective  Patient Name: Brett Villegas Date of Birth: 11-Feb-1996  MRN: [id number]  Brett Villegas  [facility], pronounced [place]) presents to the office today for follow-up evaluation and management of his autoimmune thyroid disease, both Graves disease and Hashimoto's disease.   HISTORY OF PRESENT ILLNESS:   Brett Villegas is a 25 y.o. Caucasian young man.  Brett Villegas was unaccompanied.  1. 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 He was also diagnosed with Hashimoto's disease at the same time. His mother thought that he had symptoms of Grave's disease for about 1 year prior to diagnosis. He was started on propranolol and methimazole at that time. He developed hives 1 week after starting methimazole and was started on benadryl at that time. He was given a course of steroids and changed from benadryl to atarax when he developed joint swelling and painful hands in addition to the hives. about 2 weeks after starting methimazole. His symptoms were ascribed to being secondary to his hyperthyroidism.    2. Clinical course:  A. Brett Villegas continued to take methimazole through November 2015, but had weaned off the medication in 2016 and was euthyroid in December 2016.   B. He had remained euthyroid through 12/29/19.   C. He had covid-19 in November 2020. He has completely recovered.    3.The patient's last [facility] visit was on 06/14/20. I asked him to remain off thionamides. I started him on omeprazole, 20 mg, twice daily. The medication helped. He took it for 6 months. His dyspepsia and heartburn essentially resolved. He was supposed to return to [facility]inc in 3 months, but did not.   A. In the interim, he has been generally Villegas, but is more tired and sluggish.   B. He has not been going to the gym. He is active at work, hunts a lot, and backpacks during hunts. He doesn't think his diet has changed. He feels pretty average in terms of  temperature. His temperature sensation is similar to his girlfriend.   C. He does not have many hip pains, but does have some pains in his hamstrings.    D. He has not had any hand pains for some time, but his hands are direr and he is having skin peeling.    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   F. 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   G. Since quitting smoking, his sexual function has been good.   H. He still has some anxiety from time to time associated with dreams, but not as bad as it was. He dreams every night and sometimes has sensations of deja vu during the dreams that cause him to wake up being anxious.    I. He drinks caffeinated soft drinks occasionally. He occasionally has 1-2 alcohol drinks on weekends.   3. Pertinent Review of Systems:  Constitutional: The patient feels "more tired and sluggish most of the time." By the end of the day, he is "pretty beat'.  Eyes: Vision seems to be good, but he sometimes has blurring. There are no recognized eye problems. Neck: The patient has no complaints of anterior neck swelling, soreness, tenderness, pressure, discomfort, or difficulty swallowing.  Heart: Heart rate increases with exercise or other physical activity. The patient has no complaints of palpitations, irregular heart beats, chest pain,  or chest pressure.   Lungs: No shortness of breath or wheezing.  Gastrointestinal: His reflux has pretty much resolved. Bowel movents seem normal. The patient has no complaints of excessive hunger, upset stomach, stomach aches or pains, diarrhea, or constipation. He no longer has much bleeding from external hemorrhoids.  Hands: Dry Legs: As above. Muscle mass and strength seem normal. There are no complaints of numbness, tingling, burning, or pain. No edema is noted.  Feet: There are no obvious foot  problems. There are no complaints of numbness, tingling, burning, or pain. No edema is noted. Neurologic: There are no recognized problems with muscle movement and strength, sensation, or coordination. GU: No problems Skin: Dry  PAST MEDICAL, FAMILY, AND SOCIAL HISTORY  Past Medical History:  Diagnosis Date   Goiter    Hyperthyroidism     Family History  Problem Relation Age of Onset   Thyroid disease Mother    Cancer Mother    Heart disease Paternal Aunt    Heart disease Paternal Uncle    Cancer Maternal Grandmother    Cancer Maternal Grandfather    Stroke Paternal Grandfather      Current Outpatient Medications:    hydrOXYzine (ATARAX/VISTARIL) 10 MG tablet, Take 10 mg by mouth 3 (three) times daily as needed. (Patient not taking: Reported on 06/14/2020), Disp: , Rfl:    lidocaine-prilocaine (EMLA) cream, Apply 1 application topically as needed. (Patient not taking: No sig reported), Disp: 30 g, Rfl: 4   methimazole (TAPAZOLE) 5 MG tablet, Take 1.5 tablets (7.5 mg total) by mouth daily. (Patient not taking: No sig reported), Disp: 45 tablet, Rfl: 6   omeprazole (PRILOSEC) 20 MG capsule, Take one capsule twice daily., Disp: 60 capsule, Rfl: 11   predniSONE (STERAPRED UNI-PAK) 10 MG tablet, 2 tablets each morning for 5 days (Patient not taking: No sig reported), Disp: 10 tablet, Rfl: 1   scopolamine (TRANSDERM-SCOP) 1 MG/3DAYS, Place onto the skin. (Patient not taking: Reported on 06/14/2020), Disp: , Rfl:   Allergies as of 09/24/2020 - Review Complete 06/14/2020  Allergen Reaction Noted   Dextromethorphan polistirex er  10/10/2010     reports that he has never smoked. He has never used smokeless tobacco. He reports that he does not drink alcohol and does not use drugs. Pediatric History  Patient Parents   [place],Eric (Father)   Harriss,Keila (Mother)   Other Topics Concern   Not on file  Social History Narrative   Lives with mom dad and sister. Golf. Fishing. Hunting.     Lives with his girlfriend. A baby is on the way and delivery should be in July.  Works in his [employer].  Stays active as a Banker.  Primary Care Provider: Myrlene Broker, MD  ROS: There are no other significant problems involving Brett Villegas's other body systems.    Objective:  Objective  Vital Signs:  There were no vitals taken for this visit.   Ht Readings from Last 3 Encounters:  05/20/18 5' 11.26" (1.81 m)  05/19/17 5' 11.06" (1.805 m)  01/30/14 5' 10.32" (1.786 m) (65 %, Z= 0.38)*   * Growth percentiles are based on CDC (Boys, 2-20 Years) data.   Wt Readings from Last 3 Encounters:  06/14/20 180 lb 6.4 oz (81.8 kg)  05/02/19 184 lb (83.5 kg)  05/20/18 171 lb (77.6 kg)   HC Readings from Last 3 Encounters:  No data found for HC   There is no height or weight on file to calculate  BSA. Facility age limit for growth percentiles is 20 years. Facility age limit for growth percentiles is 20 years.  PHYSICAL EXAM:  Constitutional: Brett Villegas, but somewhat overweight. The patient's height and weight are normal for age.  His weight has decreased 3 pounds from last year.  Head: The head is normocephalic. Face: The face appears normal. There are no obvious dysmorphic features. Eyes: The eyes appear to be normally formed and spaced. Gaze is conjugate. There is no obvious arcus or proptosis. Moisture appears normal. Ears: The ears are normally placed and appear externally normal. Mouth: The oropharynx and tongue appear normal. Dentition appears to be normal for age. Oral moisture is normal. Neck: The neck appears to be visibly normal. The thyroid gland is enlarged at about 22 grams in size. Both lobes are symmetrically enlarged. The isthmus is also enlarged. The consistency of the gland is relatively full, c/w Hashimoto's disease.  The thyroid gland is not tender to palpation. Lungs: The lungs are clear to auscultation. Air movement is  good. Heart: Heart rate and rhythm are regular. Heart sounds S1 and S2 are normal. I did not appreciate any pathologic cardiac murmurs. Abdomen: The abdomen is enlarged. Bowel sounds are normal. There is no obvious hepatomegaly, splenomegaly, or other mass effect.  Arms: Muscle size and bulk are normal for age. ands: There is no obvious tremor. Phalangeal and metacarpophalangeal joints are normal. Palmar muscles are normal for age. Palmar skin is dry.  Legs: Muscles appear normal for age. No edema is present.  Neurologic: Strength is normal for age in both the upper and lower extremities. Muscle tone is normal. Sensation to touch is normal in both the legs and feet.    LAB DATA:   Labs 06/05/20: TSH 5.34, free T4 1.09, free T3 3.2, TSI 0.20 (ref 0-0.55) 5.34  Labs 04/29/19: TSH 2.84, free T3 3.6; TSI, TPO, and thyroglobulin antibodies pending.   Labs 05/20/18: TSH 3.12, free T4 1.2, TPO antibody 609 (ref <9), thyroglobulin antibody 454 (ref < or = 1); TSI <89 (ref <140% baseline); CBC normal; ESR 6 (ref 0-15), CRP 1.0 (ref <8)    Assessment and Plan:  Assessment  ASSESSMENT: Kavonte is a 25 y.o. Caucasian male with history of Graves Disease currently in remission.   1. Diffuse thyrotoxicosis (Graves' Disease):   A. His TFTs are abnormal now, with an elevated TSH and lower free T4 and free T3, c/w "mild hypothyroidism.   B. His Graves' disease remains in remission, primarily due to the loss of thyrocytes caused by his Hashimoto's disease.   C. He has what the old endocrinologists used to call "burned out Graves' disease", before it was recognized that having the combination of both Graves' disease and Hashimoto's disease would usually result in a prolonged period of remission of hyperthyroidism, followed by progressive loss of thyrocytes cause by the Hashimoto's disease T lymphocytes, resulting in hypothyroidism in most patients  2. Hashimoto's disease: He has Hashimoto's disease. The  Hashimoto's disease is dominant now . 3. Weight: His weight is normal, but he has somewhat more fat weight and less muscle weight than he used to have.  4. GERD: He is doing much better and doesn't think he needs omeprazole. Marland Kitchen   PLAN:  1. Diagnostic: TSH, free T4, free T3, in one month.  2. Therapeutic: Remain off thionamide therapy.. 3. Patient education: We discussed the pathophysiology of both Graves' disease and Hashimoto's disease and the probability that he will eventually become permanently hypothyroid. We also  discussed his weight gain, our Eat Right Diet food sheet, the [place], and how to exercise for weight loss. If his TFTs are still low in one month, I will start him on levothyroxine.  4. Follow-up:  3 months   Level of Service: This visit lasted in excess of 50 minutes. More than 50% of the visit was devoted to counseling.   Tillman Sers, MD, CDE Adult and Pediatric Endocrinology

## 2020-09-24 ENCOUNTER — Ambulatory Visit (INDEPENDENT_AMBULATORY_CARE_PROVIDER_SITE_OTHER): Payer: Managed Care, Other (non HMO) | Admitting: "Endocrinology

## 2020-10-29 ENCOUNTER — Telehealth (INDEPENDENT_AMBULATORY_CARE_PROVIDER_SITE_OTHER): Payer: Self-pay | Admitting: "Endocrinology

## 2020-10-29 ENCOUNTER — Other Ambulatory Visit (INDEPENDENT_AMBULATORY_CARE_PROVIDER_SITE_OTHER): Payer: Self-pay | Admitting: Pediatrics

## 2020-10-29 DIAGNOSIS — E063 Autoimmune thyroiditis: Secondary | ICD-10-CM

## 2020-10-29 NOTE — Telephone Encounter (Signed)
  Who's calling (name and relationship to patient) : Mom - on dpr  Best contact number: [phone]  Provider they see: Fransico Michael  Reason for call: Patient has appt with Fransico Michael on Thursday and needs lab orders faxed as soon as possible to the [facility] in [place].    PRESCRIPTION REFILL ONLY  Name of prescription:  Pharmacy:

## 2020-10-29 NOTE — Telephone Encounter (Signed)
Labs electronically ordered to [facility].  Silvana Newness, MD

## 2020-10-31 LAB — TSH: TSH: 5.07 u[IU]/mL — ABNORMAL HIGH (ref 0.450–4.500)

## 2020-10-31 LAB — T4, FREE: Free T4: 0.97 ng/dL (ref 0.82–1.77)

## 2020-10-31 LAB — T3, FREE: T3, Free: 3.2 pg/mL (ref 2.0–4.4)

## 2020-10-31 NOTE — Progress Notes (Signed)
Subjective:  Subjective  Patient Name: Brett Villegas Date of Birth: April 19, 1995  MRN: [id number]  Brett Villegas  [facility], pronounced [place]) presents to the office today for follow-up evaluation and management of his autoimmune thyroid disease, both Graves disease and Hashimoto's disease.   HISTORY OF PRESENT ILLNESS:   Brett Villegas is a 25 y.o. Caucasian young man.  Brett Villegas was unaccompanied.  1. "Brett Villegas" had his initial pediatric endocrine consultation on 10/10/2010: A. Brett Villegas developed his initial hyperthyroid symptoms and signs in 2011. He developed a visible goiter in July 2012. TFTs were performed om 10/06/10. His TSH was 0.011, free T4 1.83 (ref 0.80-1.80), free T3 6.8 (ref 2.3-4.2).  He was then referred to endocrinology. His TSH was TSI was elevated at 422 (ref <140%). TPO antibody was also elevated at 8210 (ref <35). 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 He was also diagnosed with Hashimoto's disease at the same time. He was started on propranolol and methimazole at that time. He developed hives 1 week after starting methimazole and was started on Benadryl at that time. He was given a course of steroids and changed from Benadryl to Atarax when he developed joint swelling and painful hands in addition to the hives about 2 weeks after starting methimazole. His symptoms were ascribed to being secondary to his hyperthyroidism.    2. Clinical course:  A. Brett Villegas continued to take methimazole through November 2015, but had weaned off the medication in 2016 and was euthyroid in December 2016.   B. He had remained euthyroid through 12/29/19.   C. He had covid-19 in November 2020. He completely recovered.    3.The patient's last [facility] visit was on 06/14/20. I asked him to remain off thionamides. I started him on omeprazole, 20 mg, twice daily. The medication helped, but he stopped taking it because he wants to control his heartburn by  changing how he eats and what he drinks. .   A. In the interim, he has been generally healthy, but is more tired and sluggish. He and his girlfriend have a 59-monthold son, so they do not get much sleep at night.   B. He has not been going to the gym. He is active at work, hunts a lot, and backpacks during hunts. He doesn't think his diet has changed. He feels pretty average in terms of temperature. His temperature sensation is similar to his girlfriend.   C. He does not have many hip pains, but has more pains in his hamstrings.    D. He has not had any hand pains for some time, but his hands are dry.     E. 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   F. 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   G. He does not smoke very often. Sexual function has been good.   H. He rarely has some anxiety from time to time associated with dreams, but not as bad as it was. He dreams every night and sometimes has sensations of deja vu during the dreams that cause him to wake up being anxious.    I. He drinks caffeinated soft drinks occasionally. He occasionally has 1-2 alcohol drinks on every other weekend.   3. Pertinent Review of Systems:  Constitutional: The patient feels "more tired and sluggish most of the time."  By the end of the day, he is "pretty beat'.  Eyes: Vision seems to be good. There are no recognized eye problems. Neck: The patient has no complaints of anterior neck swelling, soreness, tenderness, pressure, discomfort, or difficulty swallowing.  Heart: Heart rate increases with exercise or other physical activity. The patient has no complaints of palpitations, irregular heart beats, chest pain, or chest pressure.   Lungs: No shortness of breath or wheezing.  Gastrointestinal: He has some belly hunger at times. His reflux has pretty much resolved. Bowel movents seem  normal. The patient has no complaints of excessive hunger, upset stomach, stomach aches or pains, diarrhea, or constipation. He no longer has much bleeding from external hemorrhoids.  Hands: not very dry Legs: As above. Muscle mass and strength seem normal. There are no other complaints of numbness, tingling, burning, or pain. No edema is noted.  Feet: There are no obvious foot problems. There are no complaints of numbness, tingling, burning, or pain. No edema is noted. Neurologic: There are no recognized problems with muscle movement and strength, sensation, or coordination. GU: No problems Skin: Dry  PAST MEDICAL, FAMILY, AND SOCIAL HISTORY  Past Medical History:  Diagnosis Date   Goiter    Hyperthyroidism     Family History  Problem Relation Age of Onset   Thyroid disease Mother    Cancer Mother    Heart disease Paternal Aunt    Heart disease Paternal Uncle    Cancer Maternal Grandmother    Cancer Maternal Grandfather    Stroke Paternal Grandfather      Current Outpatient Medications:    hydrOXYzine (ATARAX/VISTARIL) 10 MG tablet, Take 10 mg by mouth 3 (three) times daily as needed. (Patient not taking: No sig reported), Disp: , Rfl:    lidocaine-prilocaine (EMLA) cream, Apply 1 application topically as needed. (Patient not taking: No sig reported), Disp: 30 g, Rfl: 4   methimazole (TAPAZOLE) 5 MG tablet, Take 1.5 tablets (7.5 mg total) by mouth daily. (Patient not taking: No sig reported), Disp: 45 tablet, Rfl: 6   omeprazole (PRILOSEC) 20 MG capsule, Take one capsule twice daily., Disp: 60 capsule, Rfl: 11   predniSONE (STERAPRED UNI-PAK) 10 MG tablet, 2 tablets each morning for 5 days (Patient not taking: No sig reported), Disp: 10 tablet, Rfl: 1   scopolamine (TRANSDERM-SCOP) 1 MG/3DAYS, Place onto the skin. (Patient not taking: No sig reported), Disp: , Rfl:   Allergies as of 11/01/2020 - Review Complete 11/01/2020  Allergen Reaction Noted   Dextromethorphan polistirex  er  10/10/2010     reports that he has never smoked. He has never used smokeless tobacco. He reports that he does not drink alcohol and does not use drugs. Pediatric History  Patient Parents   Sacra,Eric (Father)   Ashworth,Keila (Mother)   Other Topics Concern   Not on file  Social History Narrative   Lives with mom dad and sister. Golf. Fishing. Hunting.    Lives with his girlfriend and their new baby. He works in his [employer].  He stays active as a Banker.  Primary Care Provider: Myrlene Broker, MD  ROS: There are no other significant problems involving Eyden's other body systems.    Objective:  Objective  Vital Signs:  BP 118/76 (BP Location: Right Arm, Patient Position: Sitting, Cuff Size: Normal)   Pulse 74   Wt 182 lb 12.8 oz (82.9 kg)   BMI 25.31 kg/m    Ht Readings from Last 3 Encounters:  05/20/18 5' 11.26" (1.81 m)  05/19/17 5' 11.06" (1.805 m)  01/30/14 5' 10.32" (1.786 m) (65 %, Z= 0.38)*   * Growth percentiles are based on CDC (Boys, 2-20 Years) data.   Wt Readings from Last 3 Encounters:  11/01/20 182 lb 12.8 oz (82.9 kg)  06/14/20 180 lb 6.4 oz (81.8 kg)  05/02/19 184 lb (83.5 kg)   HC Readings from Last 3 Encounters:  No data found for [facility]   Body surface area is 2.04 meters squared. Facility age limit for growth percentiles is 20 years. Facility age limit for growth percentiles is 20 years.  PHYSICAL EXAM:  Constitutional: Brett Villegas appears healthy, but somewhat overweight. The patient's height and weight are normal for age.  His weight has increased almost 3 pounds from last year. His affect and insight are good.  Head: The head is normocephalic. Face: The face appears normal. There are no obvious dysmorphic features. Eyes: The eyes appear to be normally formed and spaced. Gaze is conjugate. There is no obvious arcus or proptosis. Moisture appears normal. Ears: The ears are normally placed and appear externally  normal. Mouth: The oropharynx and tongue appear normal. Dentition appears to be normal for age. Oral moisture is normal. Neck: The neck appears to be visibly enlarged. The thyroid gland is more enlarged at about 22-23 grams in size. Both lobes are symmetrically enlarged. The isthmus is also enlarged. The consistency of the gland is relatively full, c/w Hashimoto's disease.  The thyroid gland is tender to palpation in both lobes, right worse than left.  Lungs: The lungs are clear to auscultation. Air movement is good. Heart: Heart rate and rhythm are regular. Heart sounds S1 and S2 are normal. I did not appreciate any pathologic cardiac murmurs. Abdomen: The abdomen is enlarged. Bowel sounds are normal. There is no obvious hepatomegaly, splenomegaly, or other mass effect.  Arms: Muscle size and bulk are normal for age. Hands: There is no obvious tremor. Phalangeal and metacarpophalangeal joints are normal. Palmar muscles are normal for age. Palmar skin is dry.  Legs: Muscles appear normal for age. No edema is present.  Neurologic: Strength is normal for age in both the upper and lower extremities. Muscle tone is normal. Sensation to touch is normal in both the legs and feet.    LAB DATA:   Labs 10/30/20: TSH 5.07, free T4 0.97, free T3 3.2  Labs 06/05/20: TSH 5.34, free T4 1.09, free T3 3.2, TSI 0.20 (ref 0-0.55)  Labs 04/29/19: TSH 2.84, free T3 3.6; TSI, TPO, and thyroglobulin antibodies pending.   Labs 05/20/18: TSH 3.12, free T4 1.2, TPO antibody 609 (ref <9), thyroglobulin antibody 454 (ref < or = 1); TSI <89 (ref <140% baseline); CBC normal; ESR 6 (ref 0-15), CRP 1.0 (ref <8)    Assessment and Plan:  Assessment  ASSESSMENT: Joakim is a 25 y.o. Caucasian male with history of Graves Disease currently in remission.   1. Diffuse thyrotoxicosis (Graves' Disease):   A. His TSH was elevated in May 2022 and was again elevated in August 2022, c/w "mild" hypothyroidism.   B. His Graves' disease  remains in remission, primarily due to the loss of thyrocytes caused by his Hashimoto's disease.   C. He has what the old endocrinologists used to call "burned out Graves' disease", before it was recognized that having the combination of both Graves' disease and Hashimoto's disease would usually result in a prolonged period of remission of hyperthyroidism, followed by progressive loss of thyrocytes cause by the  Hashimoto's disease T lymphocytes, resulting in hypothyroidism in most patients  2. Hashimoto's disease: He has Hashimoto's disease. The Hashimoto's disease is dominant now. He has all three clinical criteria for the diagnosis of Hashimoto's disease: Changing size of the thyroid gland over time, episodic thyroid tenderness, and acquired primary hypothyroidism. He also had the confirmatory evidence of a very high TPO antibody level in 2012.  3. Acquired primary hypothyroidism: Brett Villegas has now been hypothyroid twice in succession. He also has had hypothyroid symptoms twice in succession. It is time to begin levothyroxine therapy.  4. GERD: He is doing much better after changing his diet and doesn't think he needs omeprazole.   PLAN:  1. Diagnostic: TSH, free T4, free T3 in two months.   2. Therapeutic: Begin 50 mcg of levothyroxine per day.  3. Patient education: We discussed the pathophysiology of both Graves' disease and Hashimoto's disease and the probability that he will continue to lose more thyrocytes over time and require larger doses of levothyroxine over time. We briefly discussed his weight gain, our Eat Right Diet food sheet, the [place] recipes, and how to exercise for weight loss. I 4. Follow-up:  3 months   Level of Service: This visit lasted in excess of 50 minutes. More than 50% of the visit was devoted to counseling.   Tillman Sers, MD, CDE Adult and Pediatric Endocrinology

## 2020-11-01 ENCOUNTER — Encounter (INDEPENDENT_AMBULATORY_CARE_PROVIDER_SITE_OTHER): Payer: Self-pay | Admitting: "Endocrinology

## 2020-11-01 ENCOUNTER — Ambulatory Visit (INDEPENDENT_AMBULATORY_CARE_PROVIDER_SITE_OTHER): Payer: Managed Care, Other (non HMO) | Admitting: "Endocrinology

## 2020-11-01 ENCOUNTER — Other Ambulatory Visit: Payer: Self-pay

## 2020-11-01 VITALS — BP 118/76 | HR 74 | Wt 182.8 lb

## 2020-11-01 DIAGNOSIS — E063 Autoimmune thyroiditis: Secondary | ICD-10-CM

## 2020-11-01 DIAGNOSIS — K219 Gastro-esophageal reflux disease without esophagitis: Secondary | ICD-10-CM

## 2020-11-01 DIAGNOSIS — E0500 Thyrotoxicosis with diffuse goiter without thyrotoxic crisis or storm: Secondary | ICD-10-CM | POA: Diagnosis not present

## 2020-11-01 MED ORDER — LEVOTHYROXINE SODIUM 50 MCG PO TABS: ORAL_TABLET | ORAL | 11 refills | Status: AC

## 2020-11-01 NOTE — Patient Instructions (Signed)
Follow up visit in 3 months. Please have lab tess done in two months.

## 2021-02-04 ENCOUNTER — Ambulatory Visit (INDEPENDENT_AMBULATORY_CARE_PROVIDER_SITE_OTHER): Payer: Managed Care, Other (non HMO) | Admitting: "Endocrinology

## 2023-11-07 ENCOUNTER — Encounter (HOSPITAL_BASED_OUTPATIENT_CLINIC_OR_DEPARTMENT_OTHER): Payer: Self-pay

## 2023-11-07 ENCOUNTER — Ambulatory Visit (HOSPITAL_BASED_OUTPATIENT_CLINIC_OR_DEPARTMENT_OTHER)
Admission: EM | Admit: 2023-11-07 | Discharge: 2023-11-07 | Disposition: A | Discharge status: Home / Self Care | Attending: Family Medicine | Admitting: Family Medicine

## 2023-11-07 DIAGNOSIS — M5442 Lumbago with sciatica, left side: Secondary | ICD-10-CM | POA: Diagnosis not present

## 2023-11-07 DIAGNOSIS — M62830 Muscle spasm of back: Secondary | ICD-10-CM | POA: Diagnosis not present

## 2023-11-07 MED ORDER — CYCLOBENZAPRINE HCL 5 MG PO TABS: 5.0000 mg | ORAL_TABLET | Freq: Every evening | ORAL | 0 refills | Status: AC | PRN

## 2023-11-07 MED ORDER — TRIAMCINOLONE ACETONIDE 40 MG/ML IJ SUSP
60.0000 mg | Freq: Once | INTRAMUSCULAR | Status: AC
  Administered 2023-11-07: 60 mg via INTRAMUSCULAR

## 2023-11-07 NOTE — ED Triage Notes (Signed)
 On Tuesday, he was working and his back has been getting worse It hurts to be in a car, unable to sit for a long time, hurts to stand up straight  Lower sharp pain Has a bump on left side  Has been taking OTC pain medication and nothing is helping  Cold compress made it worse

## 2023-11-07 NOTE — Discharge Instructions (Addendum)
 Lower back pain with muscle spasms and sciatica to the left: Kenalog  60 mg injection in the visit (this is a steroid injection).  Cyclobenzaprine , 5 mg, nightly for muscle spasms.  Do not use the cyclobenzaprine  and drive.  Do not use Advil, Motrin, Ibuprofen, Aleve, Naproxen Sodium (NSAIDS) for 1 to 2 weeks during/after using oral steroids or after a steroid injection.  May use Tylenol/Acetaminophen, 500 mg,1-1.5 pills, every 6 hours as needed for pain.   If symptoms persist see family practice.  You may need referral for physical therapy or orthopedics or both.

## 2023-11-07 NOTE — ED Provider Notes (Signed)
 [facility]    CSN: [id number] Arrival date & time: 11/07/23  1345      History   Chief Complaint Chief Complaint  Patient presents with   Back Pain    HPI Brett Villegas is a 28 y.o. male.   28 year old male with acute lower back pain that radiates to his left hip and leg.  Symptoms started on 11/03/2023.  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  The day he was working on this pump and doing a lot of pulling he did not feel hurt but the pain started on 11/03/2023.  The pain has gotten worse over time.  He has got some swollen muscle on the left side.  He has tried OTC NSAIDs, ice, heat.  Nothing seems to be helping.  And now hurts to sit in his vehicle.  He does better standing or walking.   Back Pain Associated symptoms: no abdominal pain, no chest pain, no dysuria and no fever     Past Medical History:  Diagnosis Date   Goiter    Hyperthyroidism     Patient Active Problem List   Diagnosis Date Noted   Hypothyroidism, acquired, autoimmune 11/01/2020   Thyrotoxicosis with diffuse goiter 05/02/2019   Thyroiditis, autoimmune 05/02/2019   GERD (gastroesophageal reflux disease) 05/02/2019   Joint pain 05/20/2018   H/O Graves' disease 02/18/2016   Erectile dysfunction 02/18/2016   Goiter 10/10/2010    History reviewed. No pertinent surgical history.     Home Medications    Prior to Admission medications   Medication Sig Start Date End Date Taking? Authorizing Provider  cyclobenzaprine  (FLEXERIL ) 5 MG tablet Take 1 tablet (5 mg total) by mouth at bedtime as needed for up to 15 days for muscle spasms. 11/07/23 11/22/23 Yes Ival Domino, FNP  levothyroxine  (SYNTHROID ) 50 MCG tablet Take one tablet daily. 11/01/20   Brennan, Michael J, MD  lidocaine -prilocaine  (EMLA ) cream Apply 1 application topically as needed. Patient not taking: No sig reported 12/18/14   Dorrene Nest, MD   methimazole  (TAPAZOLE ) 5 MG tablet Take 1.5 tablets (7.5 mg total) by mouth daily. Patient not taking: No sig reported 03/24/13   Dorrene Nest, MD  omeprazole  (PRILOSEC) 20 MG capsule Take one capsule twice daily. 05/02/19 05/01/20  Hershal Ozell PARAS, MD  predniSONE  (STERAPRED UNI-PAK) 10 MG tablet 2 tablets each morning for 5 days Patient not taking: No sig reported 11/06/10   Hershal Ozell PARAS, MD  scopolamine (TRANSDERM-SCOP) 1 MG/3DAYS Place onto the skin. Patient not taking: No sig reported 08/16/18   [provider]    Family History Family History  Problem Relation Age of Onset   Thyroid  disease Mother    Cancer Mother    Heart disease Paternal Aunt    Heart disease Paternal Uncle    Cancer Maternal Grandmother    Cancer Maternal Grandfather    Stroke Paternal Grandfather     Social History Social History   Tobacco Use   Smoking status: Never   Smokeless tobacco: Never  Vaping Use   Vaping status: Never Used  Substance Use Topics   Alcohol use: No   Drug use: No     Allergies   Dextromethorphan polistirex er   Review of Systems Review of Systems  Constitutional:  Negative for chills and fever.  HENT:  Negative for ear pain and sore throat.   Eyes:  Negative for pain and visual disturbance.  Respiratory:  Negative for cough.   Cardiovascular:  Negative for chest pain and palpitations.  Gastrointestinal:  Negative for abdominal pain, constipation, diarrhea, nausea and vomiting.  Genitourinary:  Negative for dysuria and hematuria.  Musculoskeletal:  Positive for back pain. Negative for arthralgias.  Skin:  Negative for color change and rash.  Neurological:  Negative for seizures and syncope.  All other systems reviewed and are negative.    Physical Exam Triage Vital Signs ED Triage Vitals  Encounter Vitals Group     BP 11/07/23 1432 126/83     Girls Systolic BP Percentile --      Girls Diastolic BP Percentile --      Boys Systolic BP  Percentile --      Boys Diastolic BP Percentile --      Pulse Rate 11/07/23 1432 83     Resp 11/07/23 1432 18     Temp 11/07/23 1432 98.6 F (37 C)     Temp Source 11/07/23 1432 Oral     SpO2 11/07/23 1432 97 %     Weight --      Height --      Head Circumference --      Peak Flow --      Pain Score 11/07/23 1429 8     Pain Loc --      Pain Education --      Exclude from Growth Chart --    No data found.  Updated Vital Signs BP 126/83 (BP Location: Right Arm)   Pulse 83   Temp 98.6 F (37 C) (Oral)   Resp 18   SpO2 97%   Visual Acuity Right Eye Distance:   Left Eye Distance:   Bilateral Distance:    Right Eye Near:   Left Eye Near:    Bilateral Near:     Physical Exam Vitals and nursing note reviewed.  Constitutional:      General: He is not in acute distress.    Appearance: He is well-developed. He is not ill-appearing or toxic-appearing.  HENT:     Head: Normocephalic and atraumatic.     Right Ear: Hearing, tympanic membrane, ear canal and external ear normal.     Left Ear: Hearing, tympanic membrane, ear canal and external ear normal.     Nose: No congestion or rhinorrhea.     Right Sinus: No maxillary sinus tenderness or frontal sinus tenderness.     Left Sinus: No maxillary sinus tenderness or frontal sinus tenderness.     Mouth/Throat:     Lips: Pink.     Mouth: Mucous membranes are moist.     Pharynx: Uvula midline. No oropharyngeal exudate or posterior oropharyngeal erythema.     Tonsils: No tonsillar exudate.  Eyes:     Conjunctiva/sclera: Conjunctivae normal.     Pupils: Pupils are equal, round, and reactive to light.  Cardiovascular:     Rate and Rhythm: Normal rate and regular rhythm.     Heart sounds: S1 normal and S2 normal. No murmur heard. Pulmonary:     Effort: Pulmonary effort is normal. No respiratory distress.     Breath sounds: Normal breath sounds. No decreased breath sounds, wheezing, rhonchi or rales.  Abdominal:     General:  Bowel sounds are normal.     Palpations: Abdomen is soft.     Tenderness: There is no abdominal tenderness.  Musculoskeletal:        General: No swelling.  Cervical back: Normal and neck supple.     Thoracic back: Normal.     Lumbar back: Swelling (Left lower back with a swollen area that slightly purple like it is bruised.  See photo for more information.), spasms, tenderness and bony tenderness present. No edema, deformity, signs of trauma or lacerations. Normal range of motion. No scoliosis.     Comments: He has minimal tenderness in the bony spine area more tenderness along the left lower back and even along the back of the pelvis.  He has some muscle spasms.  He had significant pain with straight leg lifting on the left and some pain with rotation of the leg through the hip range of motion on the left.  Right straight leg lifting and hip rotation did not cause pain.  Lymphadenopathy:     Head:     Right side of head: No submental, submandibular, tonsillar, preauricular or posterior auricular adenopathy.     Left side of head: No submental, submandibular, tonsillar, preauricular or posterior auricular adenopathy.     Cervical: No cervical adenopathy.     Right cervical: No superficial cervical adenopathy.    Left cervical: No superficial cervical adenopathy.  Skin:    General: Skin is warm and dry.     Capillary Refill: Capillary refill takes less than 2 seconds.     Findings: No rash.  Neurological:     Mental Status: He is alert and oriented to person, place, and time.     Deep Tendon Reflexes: Reflexes are normal and symmetric.  Psychiatric:        Mood and Affect: Mood normal.      UC Treatments / Results  Labs (all labs ordered are listed, but only abnormal results are displayed) Labs Reviewed - No data to display  EKG   Radiology No results found.  Procedures Procedures (including critical care time)  Medications Ordered in UC Medications  triamcinolone   acetonide (KENALOG -40) injection 60 mg (60 mg Intramuscular Given 11/07/23 1528)    Initial Impression / Assessment and Plan / UC Course  I have reviewed the triage vital signs and the nursing notes.  Pertinent labs & imaging results that were available during my care of the patient were reviewed by me and considered in my medical decision making (see chart for details).  Plan of Care: Lower back pain with muscle spasms and sciatica to the left: Kenalog  60 mg injection now.  Cyclobenzaprine  5 mg nightly for muscle spasms.  Do not use the cyclobenzaprine  and drive.  See discharge instructions for cautions and education.  Use acetaminophen if needed for pain but avoid NSAIDs for at least 2 weeks after shot.  If symptoms persist, needs to see family practice and get referral to physical therapy or orthopedics of both.  Follow-up here if needed.  I reviewed the plan of care with the patient and/or the patient's guardian.  The patient and/or guardian had time to ask questions and acknowledged that the questions were answered.  I provided instruction on symptoms or reasons to return here or to go to an ER, if symptoms/condition did not improve, worsened or if new symptoms occurred.  Final Clinical Impressions(s) / UC Diagnoses   Final diagnoses:  Acute midline low back pain with left-sided sciatica  Muscle spasm of back     Discharge Instructions      Lower back pain with muscle spasms and sciatica to the left: Kenalog  60 mg injection in the visit (this is a steroid injection).  Cyclobenzaprine , 5 mg, nightly for muscle spasms.  Do not use the cyclobenzaprine  and drive.  Do not use Advil, Motrin, Ibuprofen, Aleve, Naproxen Sodium (NSAIDS) for 1 to 2 weeks during/after using oral steroids or after a steroid injection.  May use Tylenol/Acetaminophen, 500 mg,1-1.5 pills, every 6 hours as needed for pain.   If symptoms persist see family practice.  You may need referral for physical therapy or  orthopedics or both.     ED Prescriptions     Medication Sig Dispense Auth. Provider   cyclobenzaprine  (FLEXERIL ) 5 MG tablet Take 1 tablet (5 mg total) by mouth at bedtime as needed for up to 15 days for muscle spasms. 15 tablet Johanna Stafford, FNP      PDMP not reviewed this encounter.   Ival Domino, FNP 11/07/23 1705
